# Patient Record
Sex: Female | Born: 1975 | Hispanic: Yes | Marital: Married | Smoking: Never smoker
Health system: Southern US, Community
[De-identification: ages and names within clinical notes are randomized; demographics above are authoritative.]

---

## 2007-07-09 ENCOUNTER — Encounter: Payer: Self-pay | Admitting: Maternal & Fetal Medicine

## 2008-06-30 ENCOUNTER — Encounter: Payer: Self-pay | Admitting: Obstetrics and Gynecology

## 2008-07-31 ENCOUNTER — Encounter: Payer: Self-pay | Admitting: Obstetrics and Gynecology

## 2008-08-25 ENCOUNTER — Encounter: Payer: Self-pay | Admitting: Obstetrics and Gynecology

## 2008-09-08 ENCOUNTER — Encounter: Payer: Self-pay | Admitting: Obstetrics and Gynecology

## 2008-09-22 ENCOUNTER — Encounter: Payer: Self-pay | Admitting: Obstetrics and Gynecology

## 2008-10-06 ENCOUNTER — Encounter: Payer: Self-pay | Admitting: Obstetrics and Gynecology

## 2008-10-20 ENCOUNTER — Encounter: Payer: Self-pay | Admitting: Obstetrics & Gynecology

## 2008-11-06 ENCOUNTER — Encounter: Payer: Self-pay | Admitting: Obstetrics and Gynecology

## 2009-07-12 IMAGING — US ULTRAOUND OB LIMITED - NRPT [facility]
1 series · 14 of 15 positions shown · non-contrast
Comparison: none

[Series 1: ultraound ob limited - nrpt [facility] · 14 of 15 slices shown]
[im 1/15]
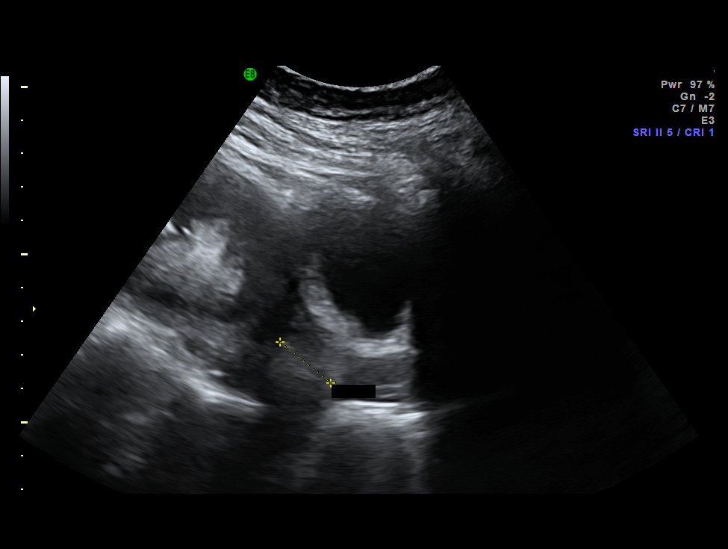
[im 2/15]
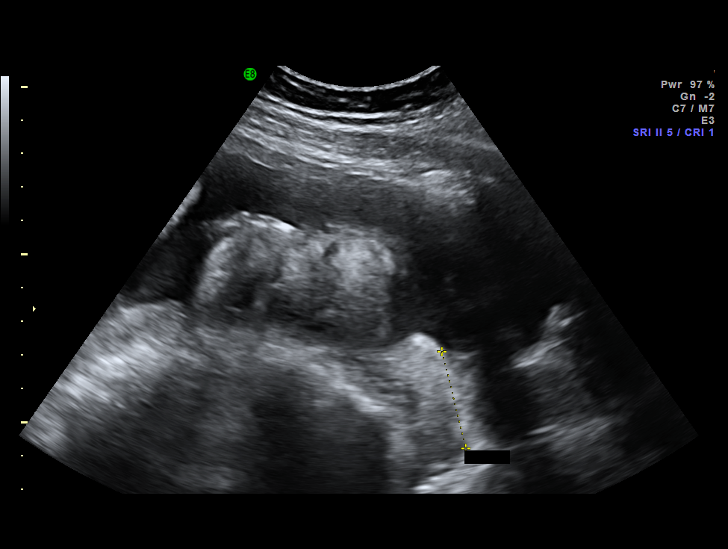
[im 3/15]
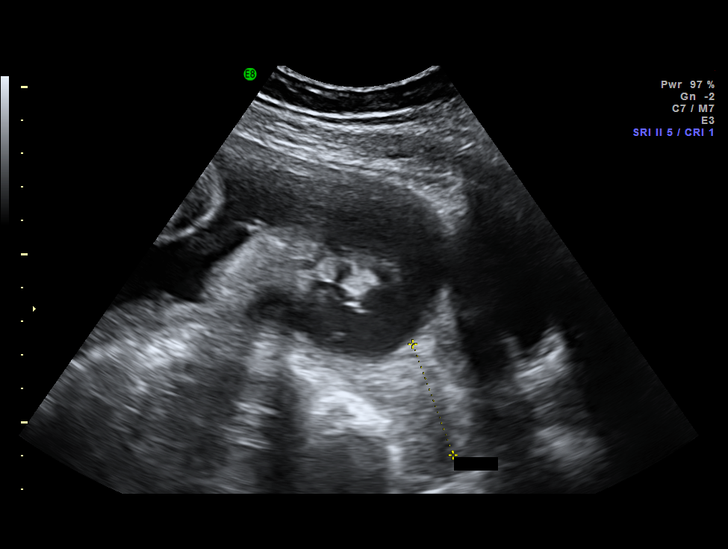
[im 4/15]
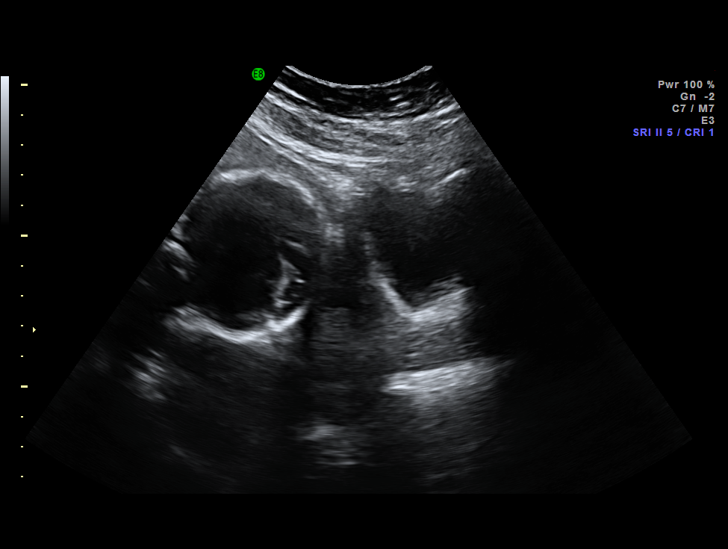
[im 5/15]
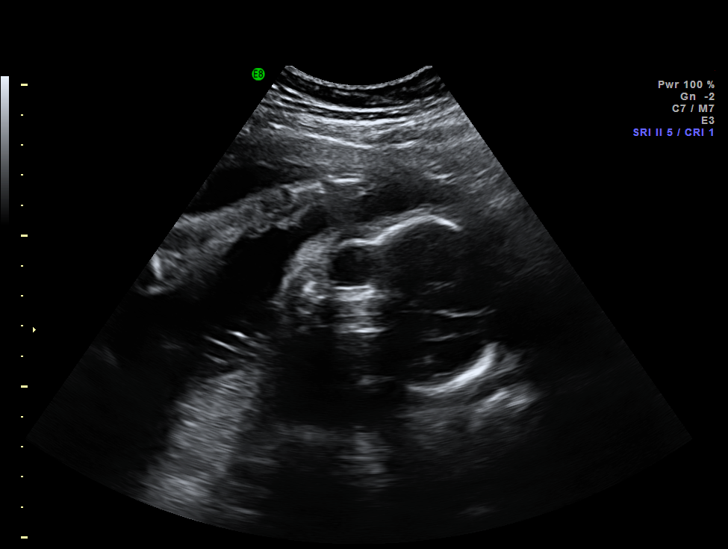
[im 6/15]
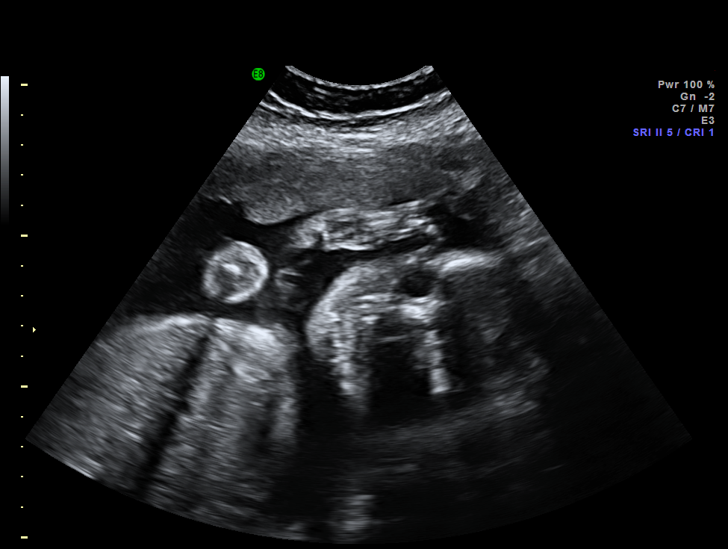
[im 7/15]
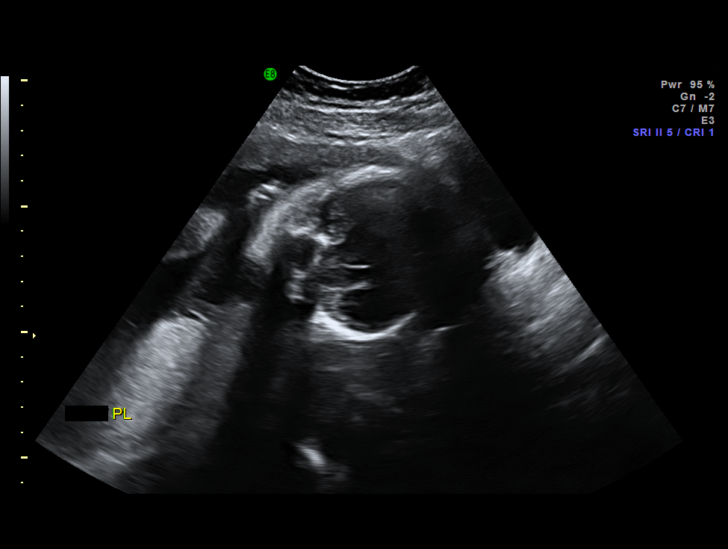
[im 9/15]
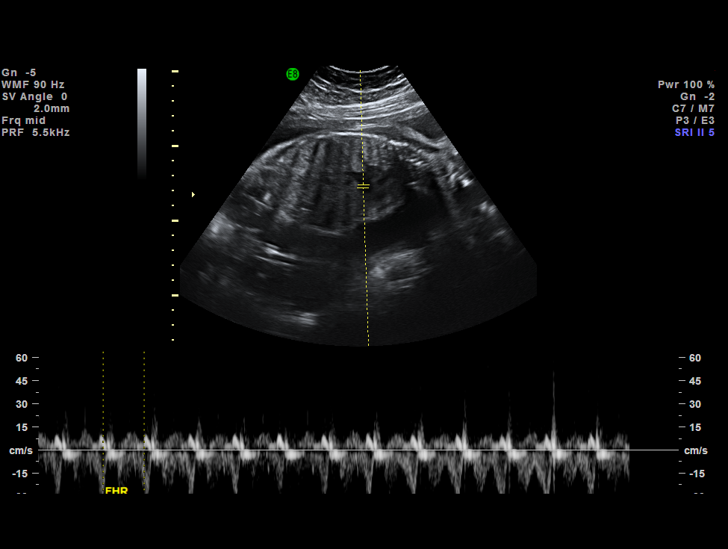
[im 10/15]
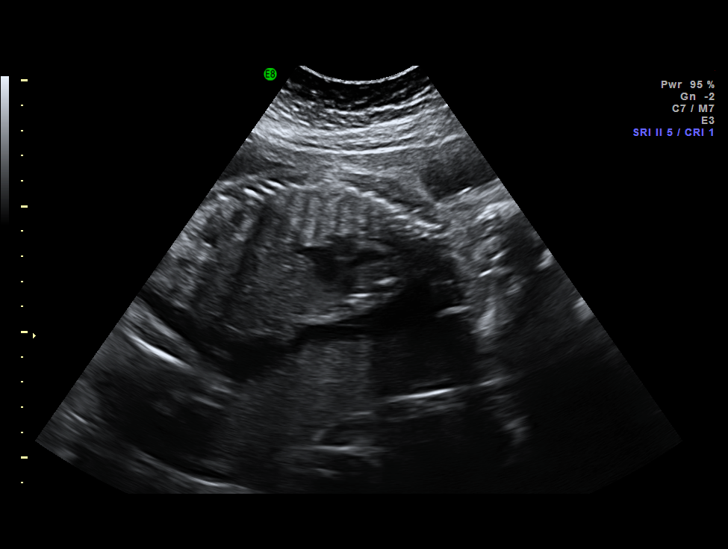
[im 11/15]
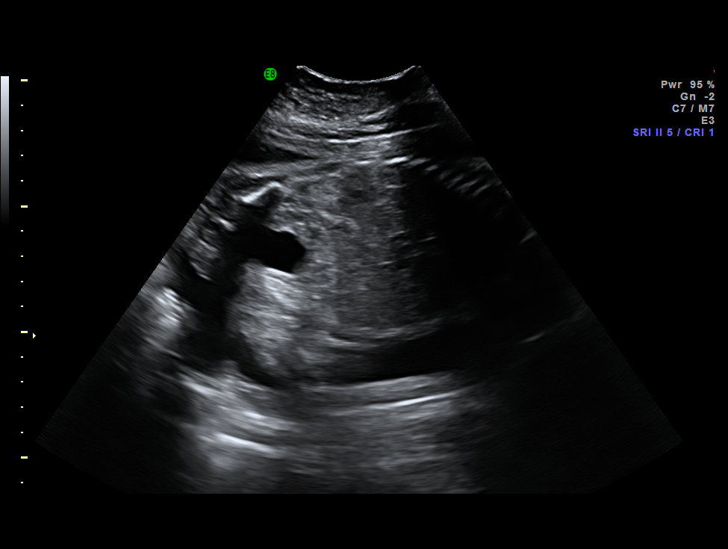
[im 12/15]
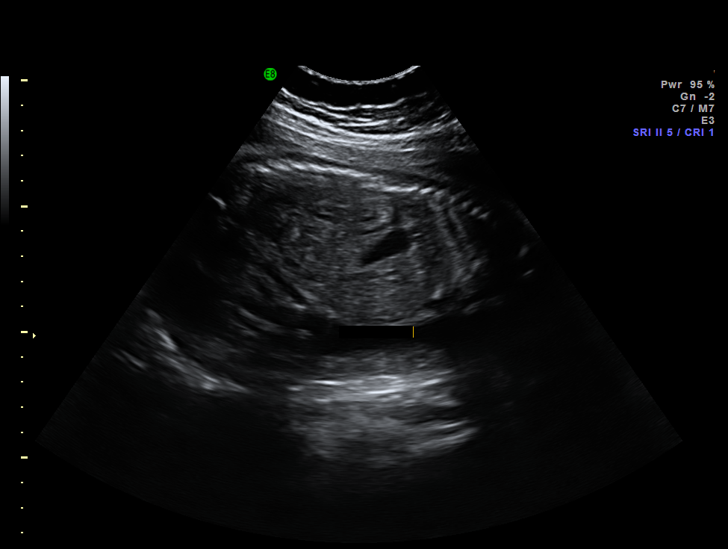
[im 13/15]
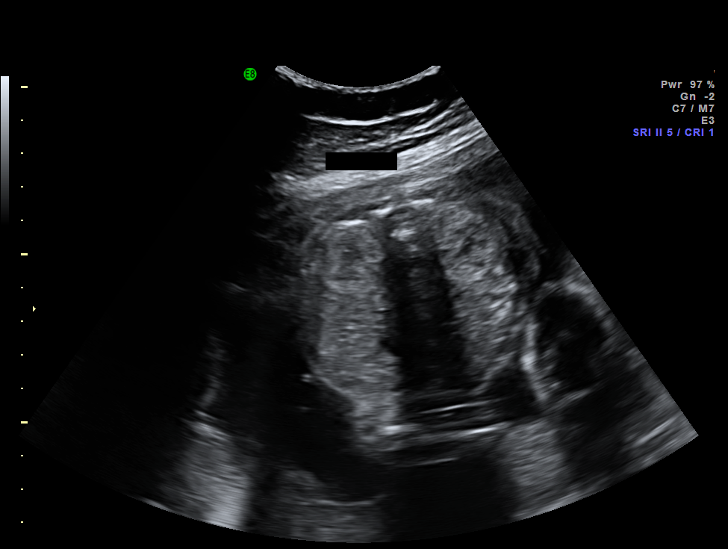
[im 14/15]
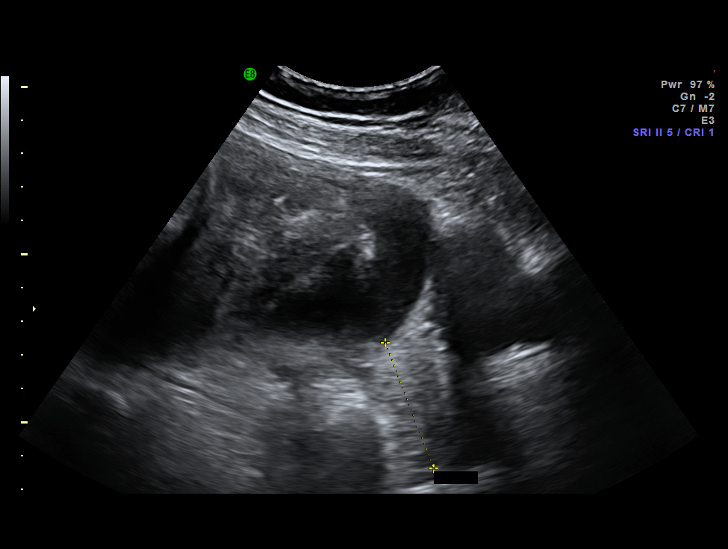
[im 15/15]
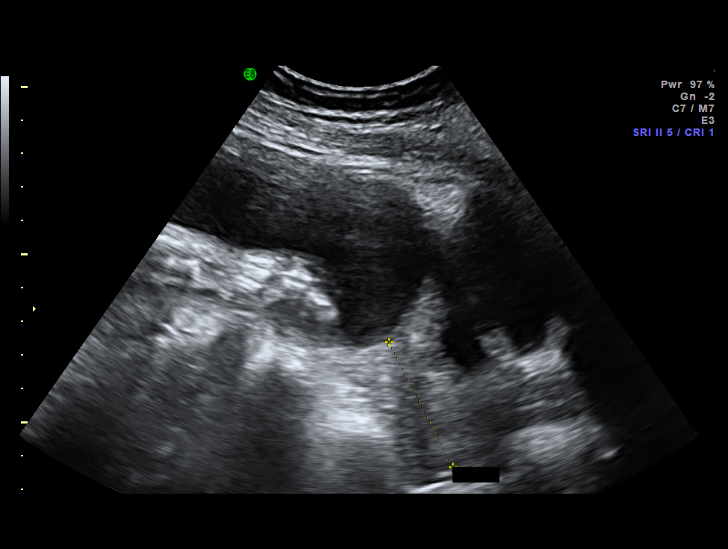

[14 of 15 positions shown; findings below may reference images not displayed]

IMAGES IMPORTED FROM THE [employer] WORKFLOW SYSTEM
NO DICTATION FOR STUDY

## 2014-11-19 ENCOUNTER — Ambulatory Visit: Payer: Self-pay

## 2014-12-12 ENCOUNTER — Other Ambulatory Visit: Payer: Self-pay | Admitting: Oncology

## 2014-12-12 DIAGNOSIS — N649 Disorder of breast, unspecified: Secondary | ICD-10-CM

## 2014-12-12 DIAGNOSIS — Z09 Encounter for follow-up examination after completed treatment for conditions other than malignant neoplasm: Secondary | ICD-10-CM

## 2015-05-22 ENCOUNTER — Ambulatory Visit
Admission: RE | Admit: 2015-05-22 | Discharge: 2015-05-22 | Disposition: A | Discharge status: Home / Self Care | Payer: Self-pay | Source: Ambulatory Visit | Attending: Oncology | Admitting: Oncology

## 2015-05-22 DIAGNOSIS — Z09 Encounter for follow-up examination after completed treatment for conditions other than malignant neoplasm: Secondary | ICD-10-CM

## 2015-05-22 DIAGNOSIS — N649 Disorder of breast, unspecified: Secondary | ICD-10-CM

## 2015-08-25 IMAGING — US US BREAST*L* LIMITED INC AXILLA
1 series · 5 of 5 positions shown · non-contrast
Comparison: None.

CLINICAL DATA: Patient complains of palpable abnormality in the
upper-inner quadrant of the left breast.

EXAM:
DIGITAL DIAGNOSTIC BILATERAL MAMMOGRAM WITH 3D TOMOSYNTHESIS WITH
CAD
ULTRASOUND LEFT BREAST

[Series 1: us breast*left* limited inc axilla · 0.08mm/px · 5 of 5 slices shown]
[im 1/5]
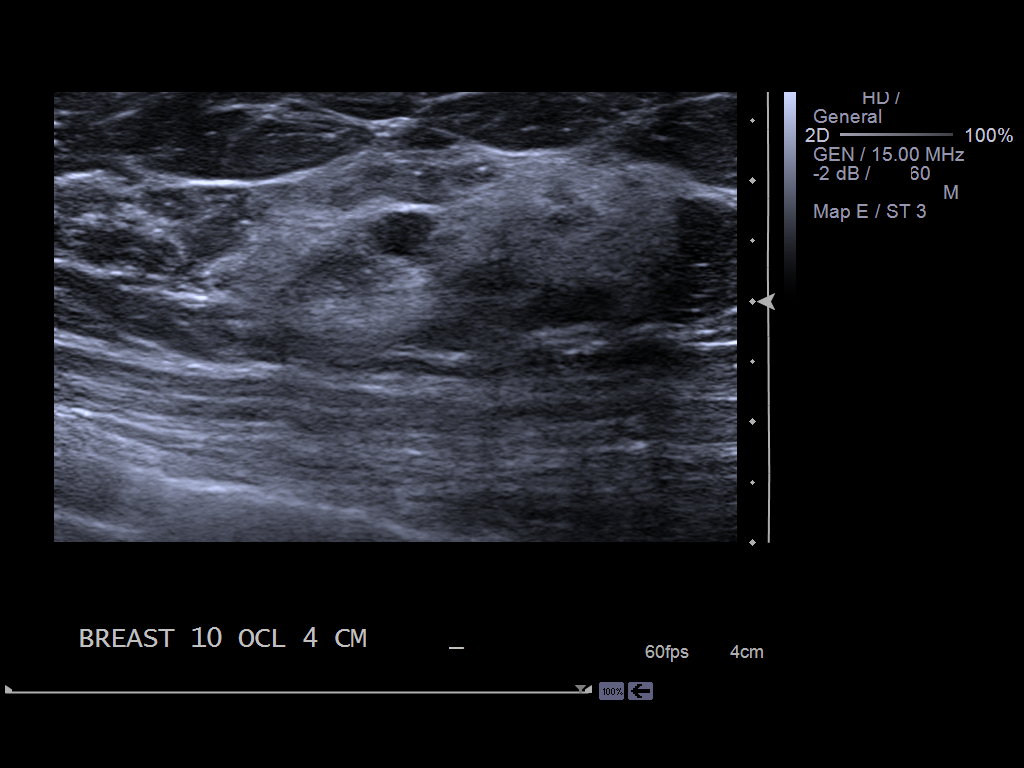
[im 2/5]
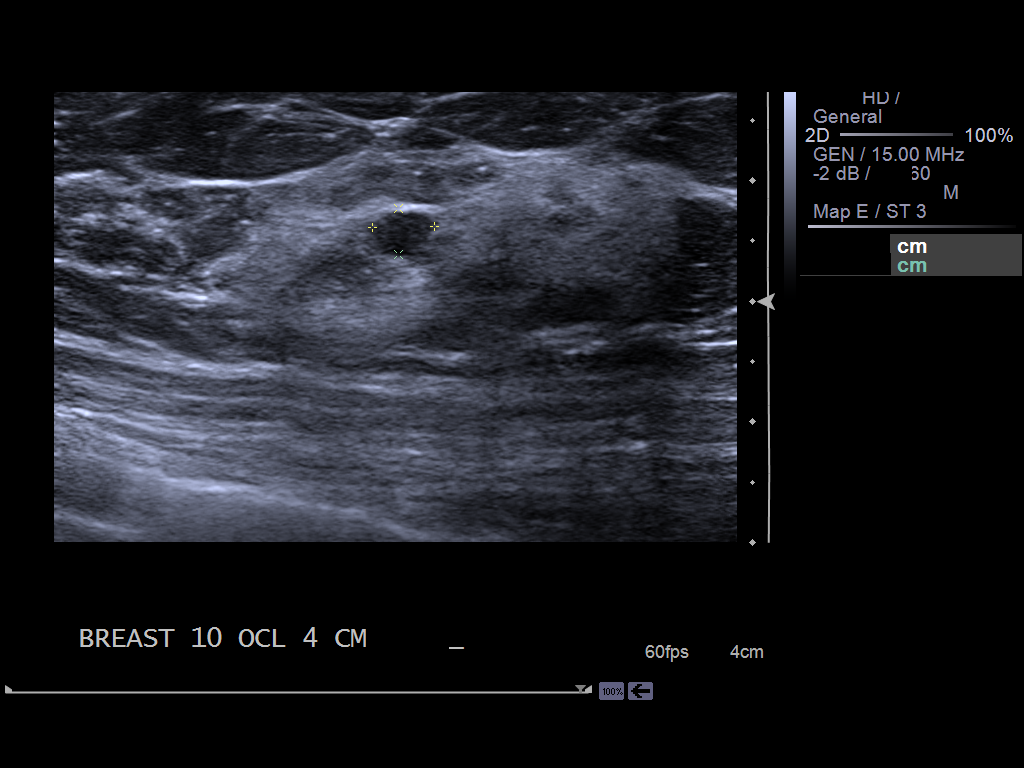
[im 3/5]
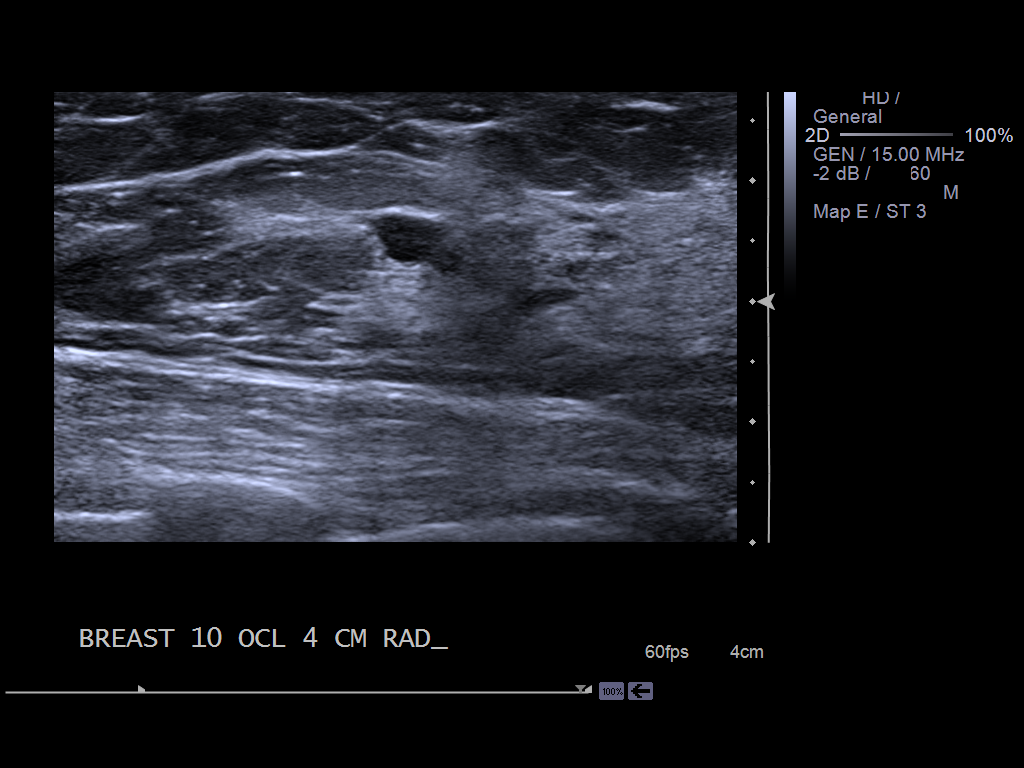
[im 4/5]
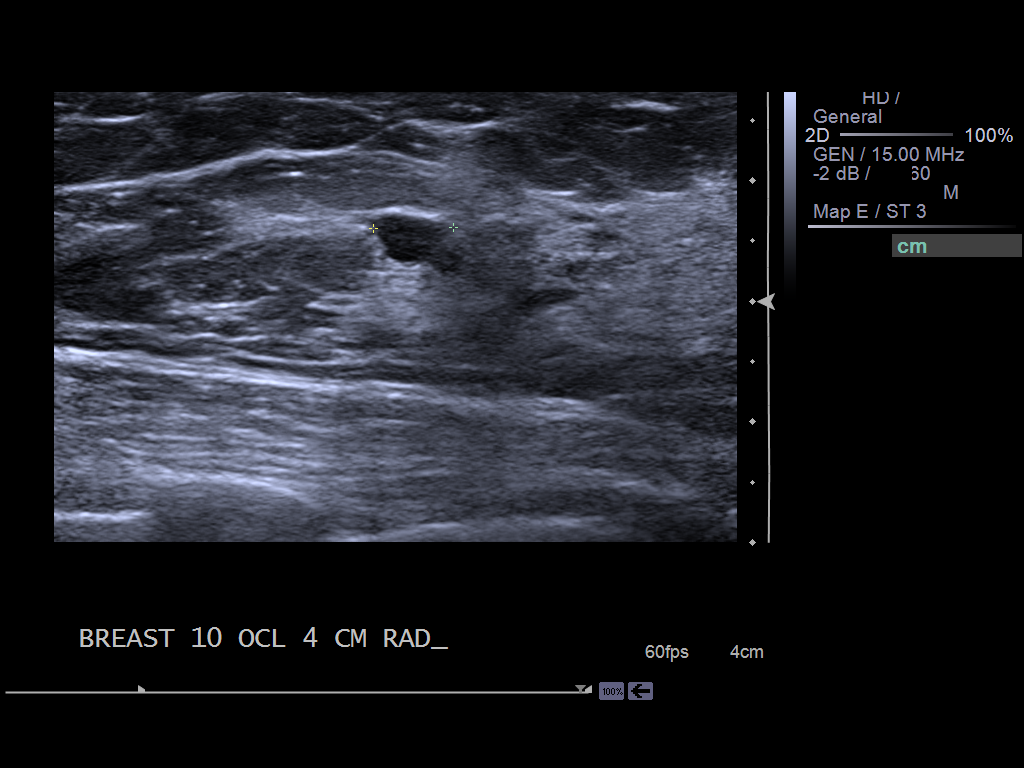
[im 5/5]
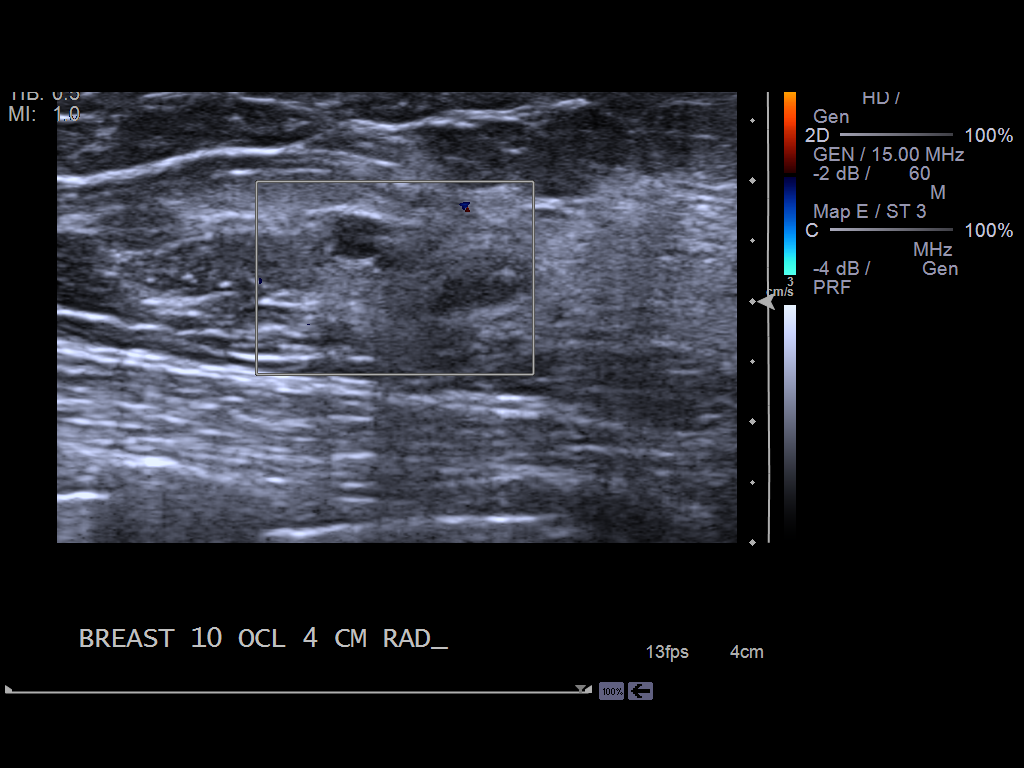

[5 of 5 positions shown; findings below may reference images not displayed]

ACR Breast Density Category c: The breast tissue is heterogeneously
dense, which may obscure small masses.
FINDINGS: No suspicious mass, malignant type microcalcifications or distortion
detected in either breast.

Mammographic images were processed with CAD.

On physical exam, I do not palpate a mass in the upper-inner
quadrant of the left breast.

Targeted ultrasound is performed showing a well-circumscribed
hypoechoic lesion with increased through transmission the left
breast at 10 o'clock 4 cm from the nipple measuring 5 x 4 x 7 mm.
This thought to be a small cyst.
IMPRESSION: Probable benign cyst in the 8 o'clock region of the low left breast.

RECOMMENDATION:
Short-term interval followup left breast ultrasound in 6 months is
recommended.

ASSESSMENT:

3: Probably benign.

## 2015-11-25 ENCOUNTER — Ambulatory Visit
Admission: RE | Admit: 2015-11-25 | Discharge: 2015-11-25 | Disposition: A | Discharge status: Home / Self Care | Payer: Self-pay | Source: Ambulatory Visit | Attending: Oncology | Admitting: Oncology

## 2015-11-25 ENCOUNTER — Encounter: Payer: Self-pay | Admitting: *Deleted

## 2015-11-25 ENCOUNTER — Ambulatory Visit: Payer: Self-pay | Attending: Oncology | Admitting: *Deleted

## 2015-11-25 VITALS — BP 107/72 | HR 68 | Temp 98.3°F | Resp 16 | Ht 64.96 in | Wt 148.8 lb

## 2015-11-25 DIAGNOSIS — N63 Unspecified lump in unspecified breast: Secondary | ICD-10-CM

## 2015-11-25 NOTE — Progress Notes (Signed)
Subjective:     Patient ID: Rigoberto NoelAidee Redwine, female   DOB: [birth year], 40 y.o.   MRN: [id number]  HPI   Review of Systems     Objective:   Physical Exam  Pulmonary/Chest: Right breast exhibits no inverted nipple, no mass, no nipple discharge, no skin change and no tenderness. Left breast exhibits mass. Breasts are symmetrical.         Assessment:     40 year old Hispanic [facility] of a left breast mass and annual screening.  On clinical breast exam I can palpate an approximate 1 cm firm, tender nodule at 11:00 left breast 3 cm from the areola.  Patient states this is the area they have been following.  Taught self breast awareness.  Offered pelvic exam, but patient refused.  Wants to continue pelvic exams with her primary care provider.  Patient has been screened for eligibility.  She does not have any insurance, Medicare or Medicaid.  She also meets financial eligibility.  Hand-out given on the Affordable Care Act.    Plan:     Will order bilateral diagnostic mammogram with ultrasound for ongoing stability of a left breast mass.  Will follow-up per [facility] protocol.

## 2015-11-25 NOTE — Patient Instructions (Signed)
Gave patient hand-out, Women Staying Healthy, Active and Well from [facility], with education on breast health, pap smears, heart and colon health. 

## 2015-12-02 ENCOUNTER — Encounter: Payer: Self-pay | Admitting: *Deleted

## 2015-12-02 NOTE — Progress Notes (Signed)
Letter mailed to inform patient of her normal mammogram.  Area of concern has been followed and is considered a benign cyst.  She is to follow-up in one year with annual screening.  [facility] to [place].

## 2017-06-19 IMAGING — US US BREAST LTD UNI LEFT INC AXILLA
1 series · 9 of 9 positions shown · non-contrast
Comparison: Previous exam(s).

CLINICAL DATA: 39-year-old female presenting for evaluation of a
probably benign left breast mass.

EXAM:
ULTRASOUND OF THE LEFT BREAST

[Series 1: us breast ltd uni left inc axilla · 0.06mm/px · 9 of 9 slices shown]
[im 1/9]
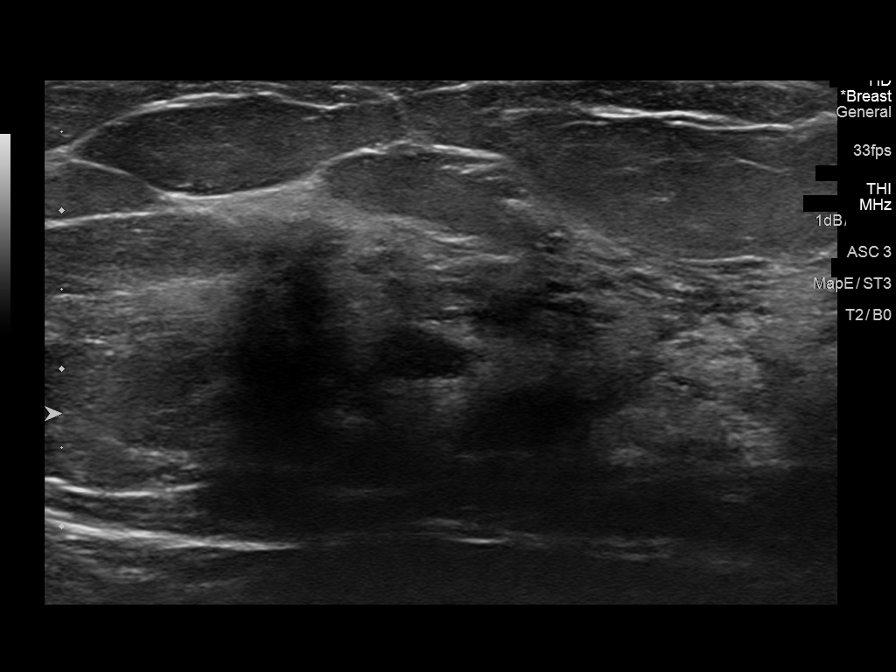
[im 2/9]
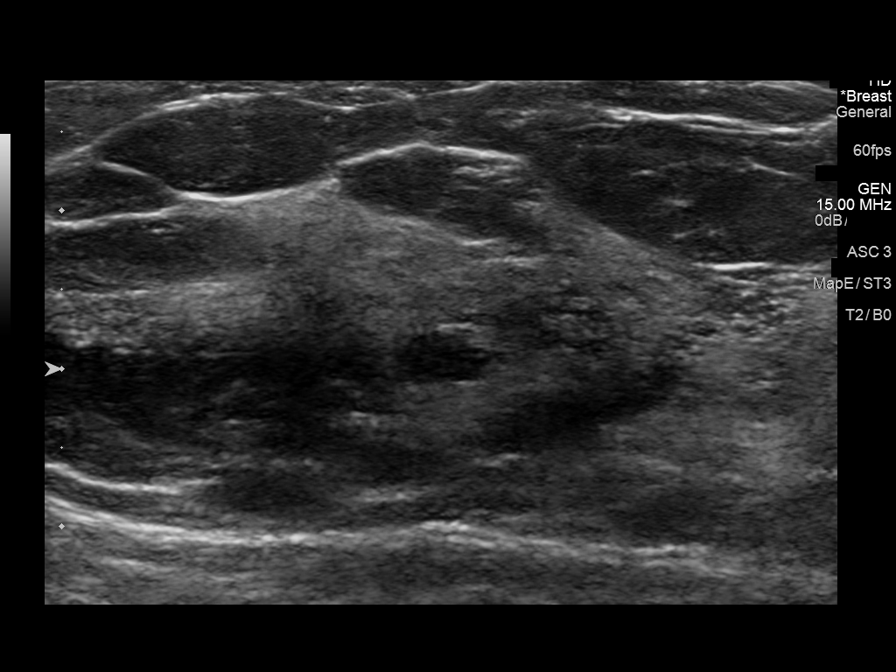
[im 3/9]
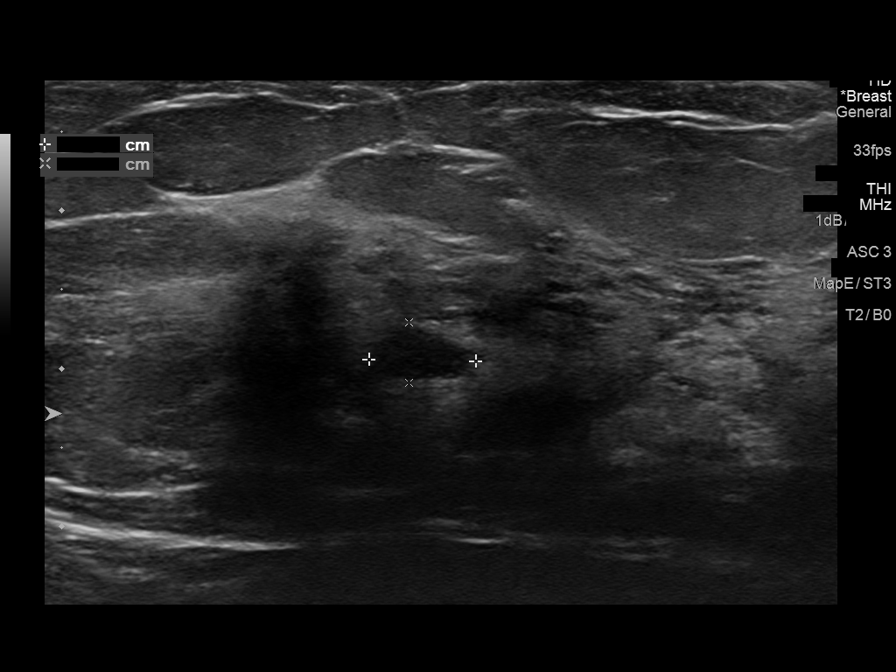
[im 4/9]
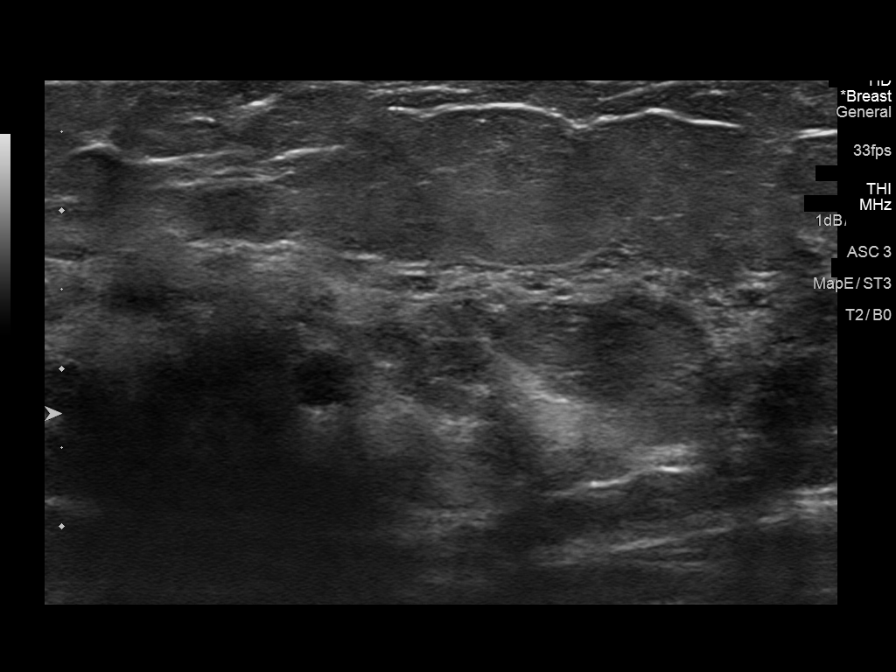
[im 5/9]
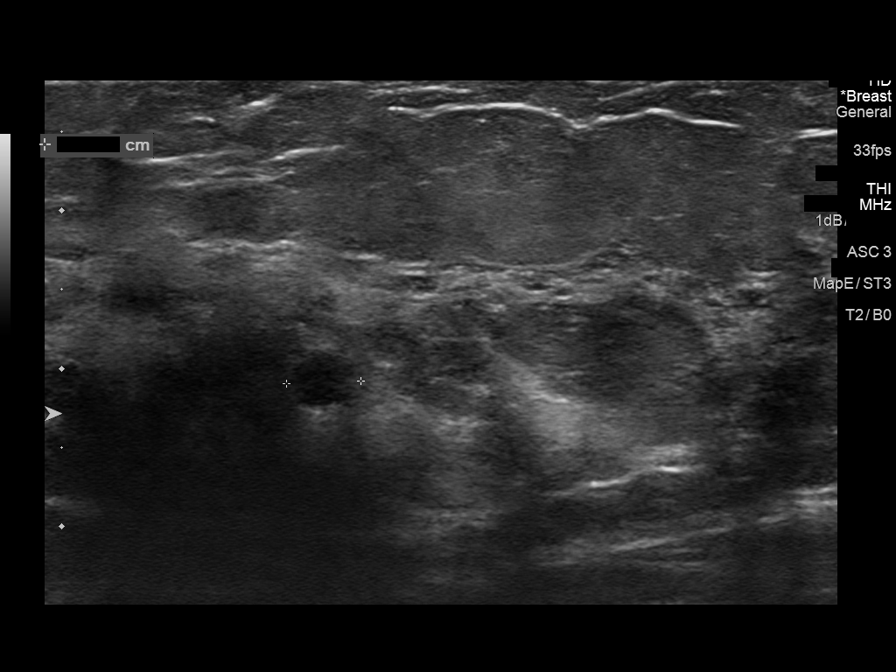
[im 6/9]
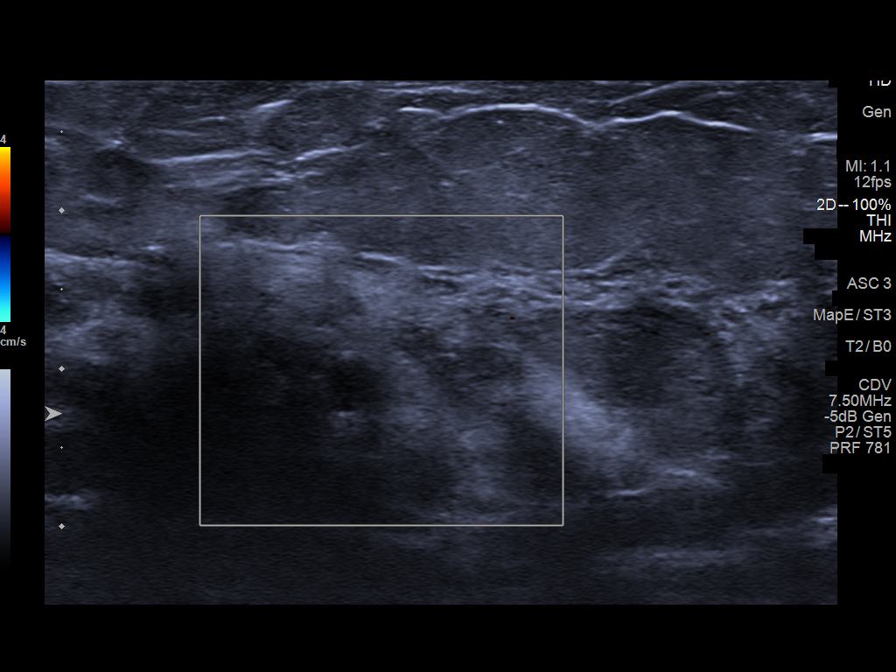
[im 7/9]
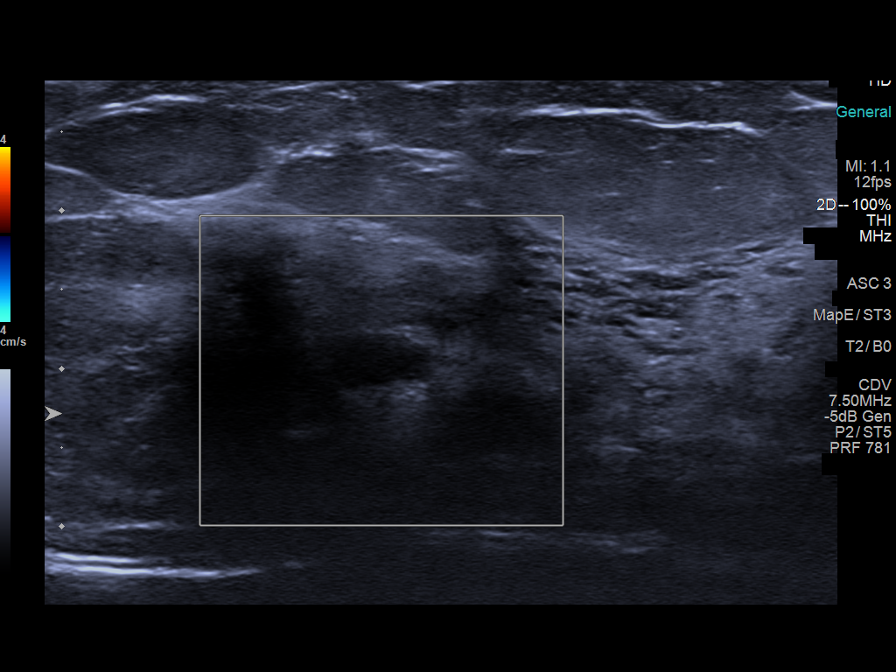
[im 8/9]
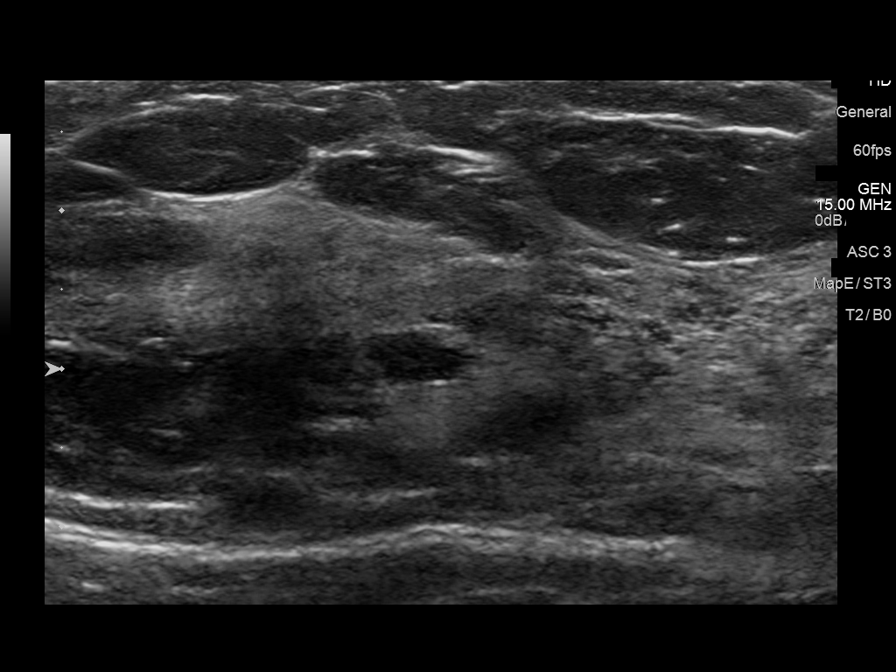
[im 9/9]
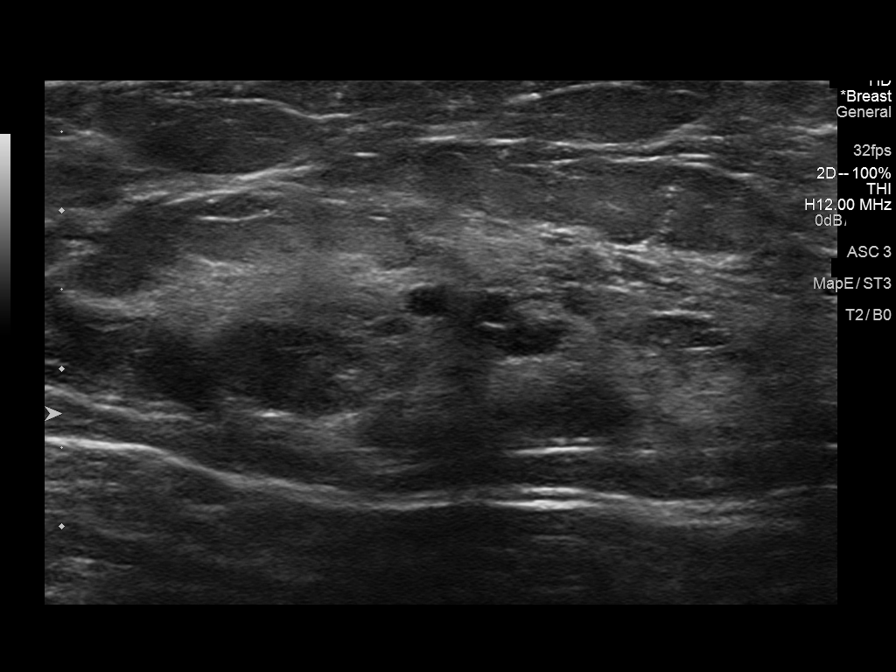

[9 of 9 positions shown; findings below may reference images not displayed]

FINDINGS: On physical exam, no distinct palpable abnormalities identified in
the upper inner quadrant of the left breast. The patient endorses
tenderness with palpation.

Targeted ultrasound is performed, showing a hypoechoic mass at 10
o'clock, 4 cm from the nipple measuring 7 x 4 x 5 mm, stable from
prior. Two circumscribed smaller hypoechoic masses are seen adjacent
to this. This likely represents a cluster of cysts.
IMPRESSION: Stable probably benign hypoechoic right breast mass at 10 o'clock.
This is favored to represent a cyst.

RECOMMENDATION:
Six-month follow-up bilateral mammogram and left breast ultrasound
is recommended to ensure continued stability of the left breast mass
at 10 o'clock.

I have discussed the findings and recommendations with the patient.
Results were also provided in writing at the conclusion of the
visit. If applicable, a reminder letter will be sent to the patient
regarding the next appointment.

BI-RADS CATEGORY  3: Probably benign finding(s) - short interval
follow-up suggested.

## 2017-12-23 IMAGING — US US BREAST*L* LIMITED INC AXILLA
1 series · 5 of 5 positions shown · non-contrast
Comparison: Previous exam(s).

CLINICAL DATA: Short-term interval followup of a probable benign
cyst in left breast.

EXAM:
2D DIGITAL DIAGNOSTIC BILATERAL MAMMOGRAM WITH CAD AND ADJUNCT TOMO
ULTRASOUND LEFT BREAST

[Series 1: us breast*left* limited inc axilla · 0.06mm/px · 5 of 5 slices shown]
[im 1/5]
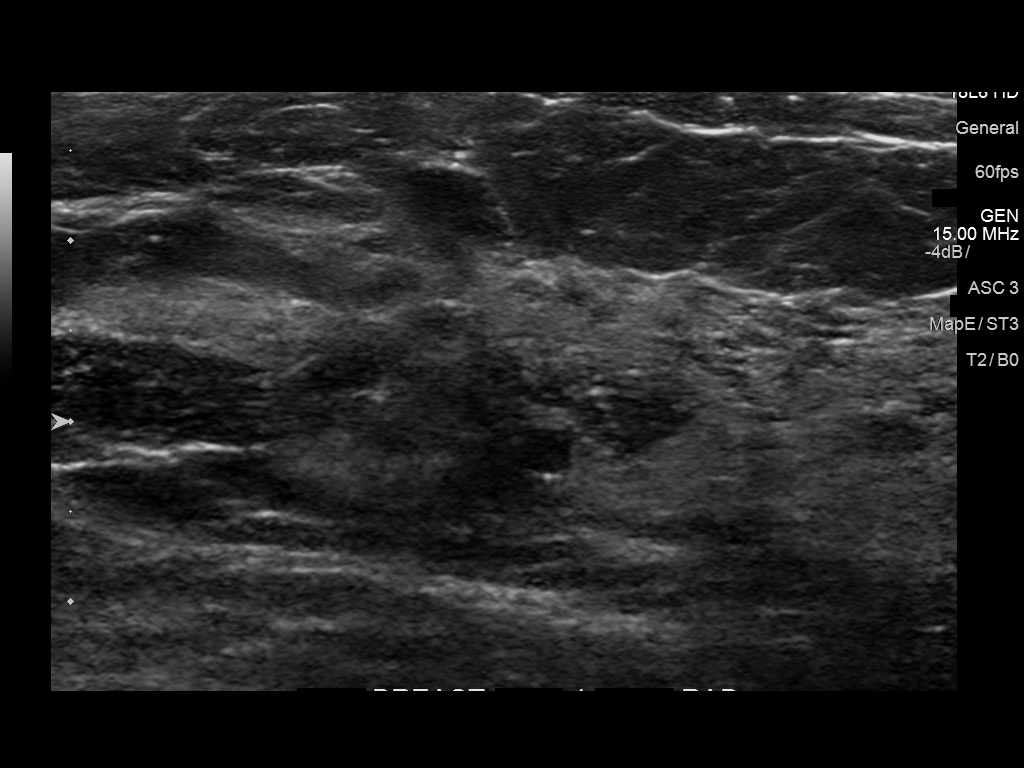
[im 2/5]
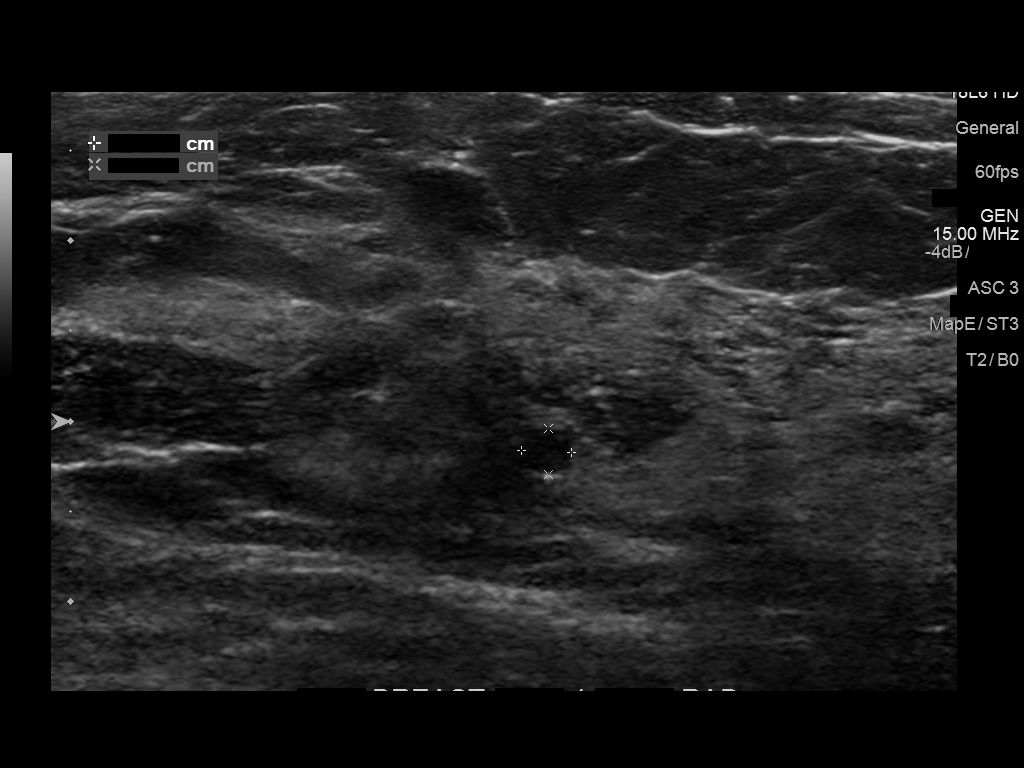
[im 3/5]
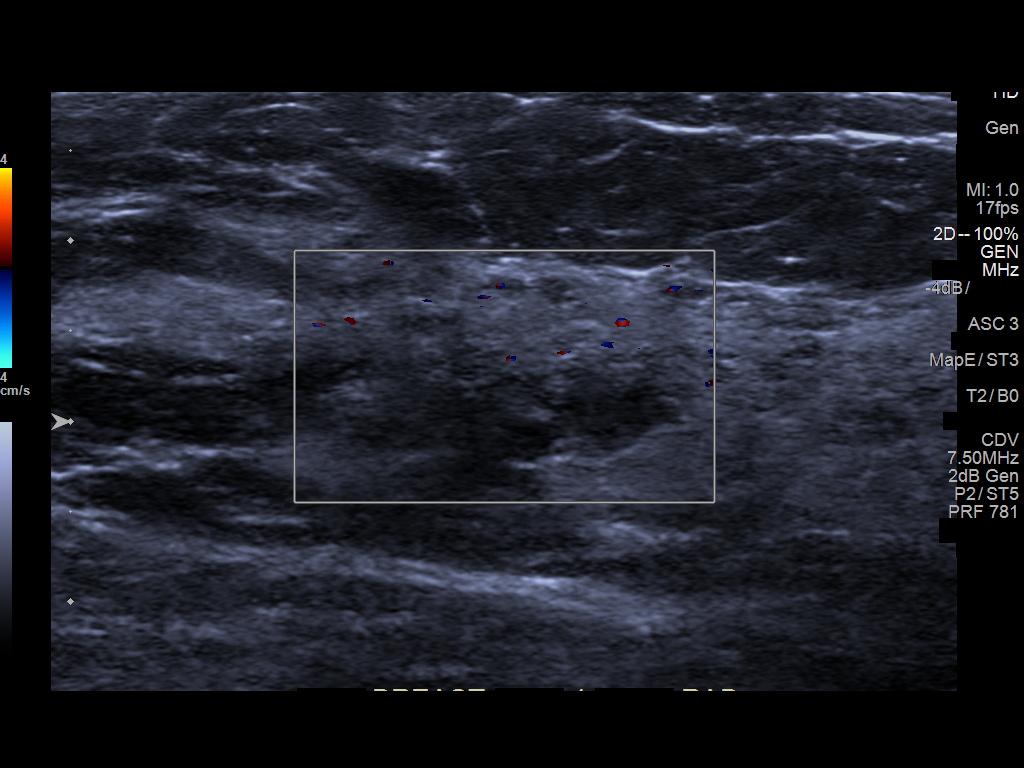
[im 4/5]
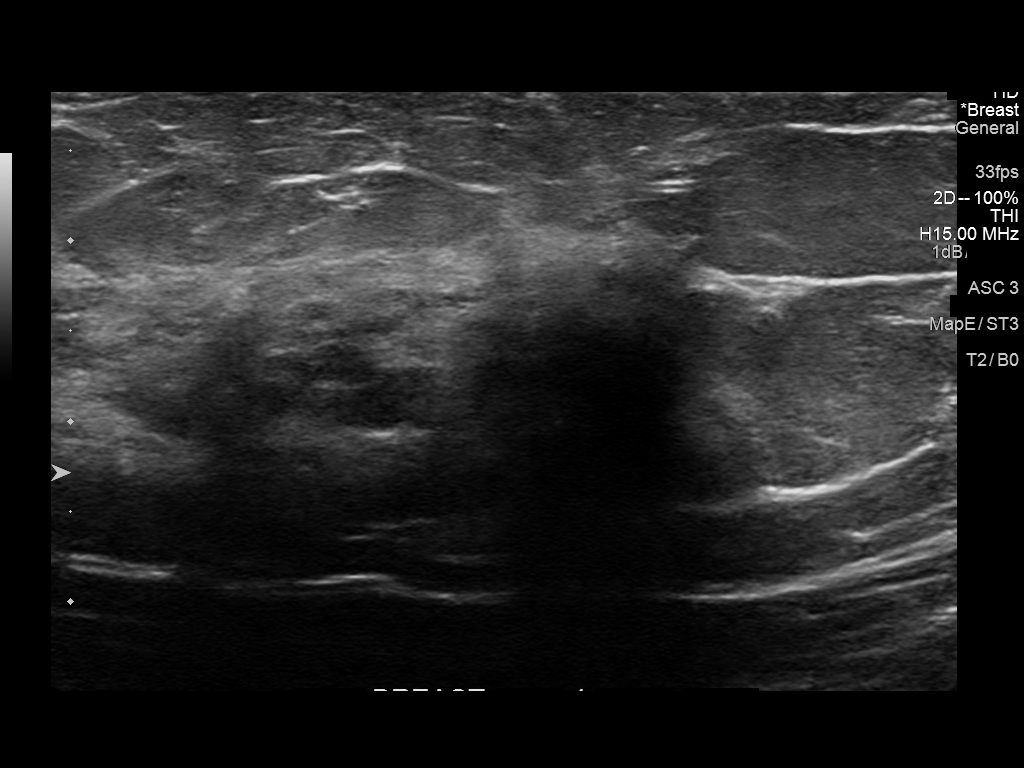
[im 5/5]
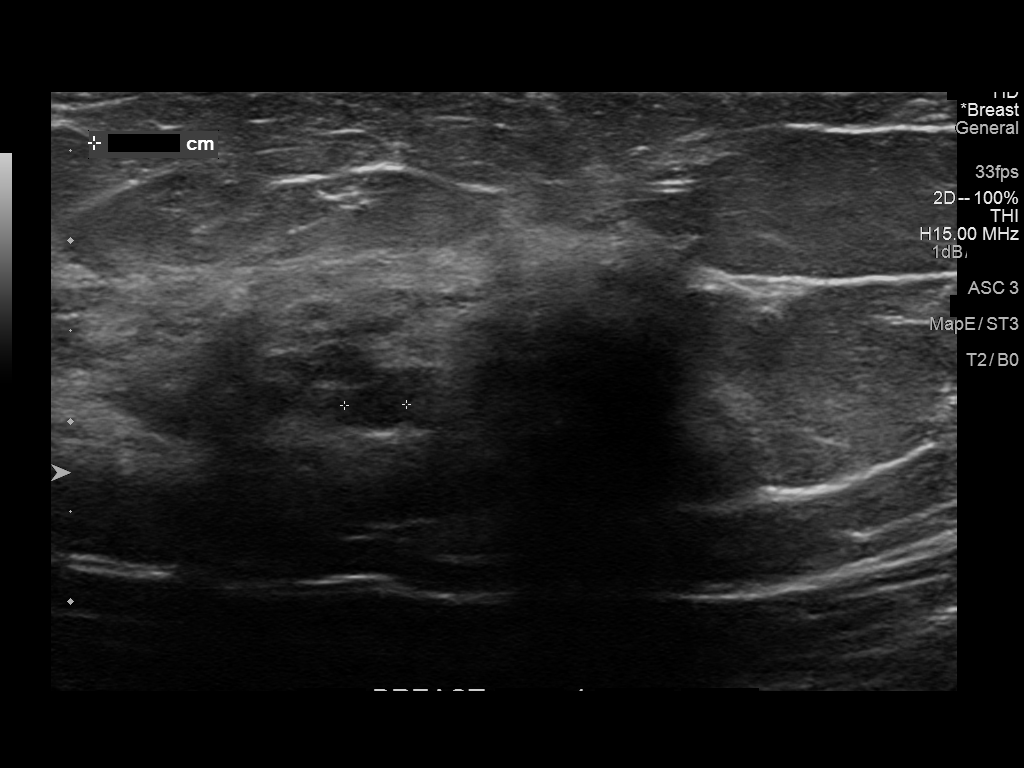

[5 of 5 positions shown; findings below may reference images not displayed]

ACR Breast Density Category c: The breast tissue is heterogeneously
dense, which may obscure small masses.
FINDINGS: No suspicious mass, malignant type microcalcifications or distortion
detected in either breast.

Mammographic images were processed with CAD.

On physical exam, I do not palpate a mass in the 10 o'clock region
of the left breast.

Targeted ultrasound is performed, showing a well-circumscribed
hypoechoic lesion with increased through transmission in the left
breast at 10 o'clock 4 cm from the nipple measuring 3 x 6 x 3 mm. On
the prior ultrasound dated 11/19/2014 it measured 7 x 5 x 4 mm. It
is felt to be a benign cyst.
IMPRESSION: No evidence of malignancy in either breast.

RECOMMENDATION:
Bilateral screening mammogram in 1 year is recommended.

I have discussed the findings and recommendations with the patient.
Results were also provided in writing at the conclusion of the
visit. If applicable, a reminder letter will be sent to the patient
regarding the next appointment.

BI-RADS CATEGORY  2: Benign.

## 2020-02-12 ENCOUNTER — Other Ambulatory Visit: Payer: Self-pay | Admitting: Student

## 2020-02-12 DIAGNOSIS — Z1231 Encounter for screening mammogram for malignant neoplasm of breast: Secondary | ICD-10-CM

## 2020-02-20 ENCOUNTER — Ambulatory Visit
Admission: RE | Admit: 2020-02-20 | Discharge: 2020-02-20 | Disposition: A | Discharge status: Home / Self Care | Payer: 59 | Source: Ambulatory Visit | Attending: Student | Admitting: Student

## 2020-02-20 DIAGNOSIS — Z1231 Encounter for screening mammogram for malignant neoplasm of breast: Secondary | ICD-10-CM | POA: Insufficient documentation

## 2022-12-05 ENCOUNTER — Other Ambulatory Visit: Payer: Self-pay | Admitting: Student

## 2022-12-05 DIAGNOSIS — Z1231 Encounter for screening mammogram for malignant neoplasm of breast: Secondary | ICD-10-CM

## 2022-12-22 ENCOUNTER — Ambulatory Visit
Admission: RE | Admit: 2022-12-22 | Discharge: 2022-12-22 | Disposition: A | Discharge status: Home / Self Care | Payer: 59 | Source: Ambulatory Visit | Attending: Student | Admitting: Student

## 2022-12-22 DIAGNOSIS — Z1231 Encounter for screening mammogram for malignant neoplasm of breast: Secondary | ICD-10-CM | POA: Insufficient documentation
